# Patient Record
Sex: Female | Born: 1990 | Race: Black or African American | Hispanic: No | Marital: Single | Smoking: Never smoker
Health system: Southern US, Community
[De-identification: ages and names within clinical notes are randomized; demographics above are authoritative.]

---

## 2018-08-25 ENCOUNTER — Encounter: Payer: Self-pay | Admitting: Obstetrics and Gynecology

## 2019-12-18 ENCOUNTER — Ambulatory Visit: Payer: Medicaid Other

## 2020-01-06 ENCOUNTER — Emergency Department
Admission: EM | Admit: 2020-01-06 | Discharge: 2020-01-06 | Disposition: A | Discharge status: Home / Self Care | Payer: Medicaid Other | Attending: Emergency Medicine | Admitting: Emergency Medicine

## 2020-01-06 ENCOUNTER — Encounter: Payer: Self-pay | Admitting: Radiology

## 2020-01-06 ENCOUNTER — Emergency Department: Payer: Medicaid Other

## 2020-01-06 ENCOUNTER — Other Ambulatory Visit: Payer: Self-pay

## 2020-01-06 DIAGNOSIS — R55 Syncope and collapse: Secondary | ICD-10-CM | POA: Insufficient documentation

## 2020-01-06 DIAGNOSIS — R519 Headache, unspecified: Secondary | ICD-10-CM | POA: Diagnosis not present

## 2020-01-06 DIAGNOSIS — Y9389 Activity, other specified: Secondary | ICD-10-CM | POA: Insufficient documentation

## 2020-01-06 DIAGNOSIS — R0789 Other chest pain: Secondary | ICD-10-CM | POA: Insufficient documentation

## 2020-01-06 DIAGNOSIS — Y999 Unspecified external cause status: Secondary | ICD-10-CM | POA: Diagnosis not present

## 2020-01-06 DIAGNOSIS — Y92410 Unspecified street and highway as the place of occurrence of the external cause: Secondary | ICD-10-CM | POA: Insufficient documentation

## 2020-01-06 DIAGNOSIS — R112 Nausea with vomiting, unspecified: Secondary | ICD-10-CM | POA: Insufficient documentation

## 2020-01-06 DIAGNOSIS — M546 Pain in thoracic spine: Secondary | ICD-10-CM | POA: Diagnosis present

## 2020-01-06 LAB — BASIC METABOLIC PANEL
Anion gap: 12 (ref 5–15)
BUN: 12 mg/dL (ref 6–20)
CO2: 27 mmol/L (ref 22–32)
Calcium: 9.2 mg/dL (ref 8.9–10.3)
Chloride: 105 mmol/L (ref 98–111)
Creatinine, Ser: 0.49 mg/dL (ref 0.44–1.00)
GFR calc Af Amer: >60 mL/min (ref >60)
GFR calc non Af Amer: >60 mL/min (ref >60)
Glucose, Bld: 100 mg/dL — ABNORMAL HIGH (ref 70–99)
Potassium: 3.6 mmol/L (ref 3.5–5.1)
Sodium: 144 mmol/L (ref 135–145)

## 2020-01-06 LAB — CBC
HCT: 39.3 % (ref 36.0–46.0)
Hemoglobin: 13.7 g/dL (ref 12.0–15.0)
MCH: 26.1 pg (ref 26.0–34.0)
MCHC: 34.9 g/dL (ref 30.0–36.0)
MCV: 74.9 fL — ABNORMAL LOW (ref 80.0–100.0)
Platelets: 321 10*3/uL (ref 150–400)
RBC: 5.25 MIL/uL — ABNORMAL HIGH (ref 3.87–5.11)
RDW: 14.8 % (ref 11.5–15.5)
WBC: 8 10*3/uL (ref 4.0–10.5)
nRBC: 0 % (ref 0.0–0.2)

## 2020-01-06 LAB — HEPATIC FUNCTION PANEL
ALT: 21 U/L (ref 0–44)
AST: 22 U/L (ref 15–41)
Albumin: 4.8 g/dL (ref 3.5–5.0)
Alkaline Phosphatase: 42 U/L (ref 38–126)
Bilirubin, Direct: <0.1 mg/dL (ref 0.0–0.2)
Total Bilirubin: 0.8 mg/dL (ref 0.3–1.2)
Total Protein: 8.1 g/dL (ref 6.5–8.1)

## 2020-01-06 LAB — HCG, QUANTITATIVE, PREGNANCY: hCG, Beta Chain, Quant, S: <1 m[IU]/mL (ref <5)

## 2020-01-06 LAB — TROPONIN I (HIGH SENSITIVITY): Troponin I (High Sensitivity): <2 ng/L (ref <18)

## 2020-01-06 MED ORDER — SODIUM CHLORIDE 0.9% FLUSH
3.0000 mL | Freq: Once | INTRAVENOUS | Status: AC
  Administered 2020-01-06: 3 mL via INTRAVENOUS

## 2020-01-06 MED ORDER — ACETAMINOPHEN 500 MG PO TABS
1000.0000 mg | ORAL_TABLET | Freq: Once | ORAL | Status: AC
  Administered 2020-01-06: 1000 mg via ORAL
  Filled 2020-01-06: qty 2

## 2020-01-06 MED ORDER — ONDANSETRON HCL 4 MG/2ML IJ SOLN
4.0000 mg | Freq: Once | INTRAMUSCULAR | Status: AC
  Administered 2020-01-06: 4 mg via INTRAVENOUS
  Filled 2020-01-06: qty 2

## 2020-01-06 MED ORDER — NAPROXEN 500 MG PO TABS
500.0000 mg | ORAL_TABLET | Freq: Two times a day (BID) | ORAL | 2 refills | Status: DC
Start: 2020-01-06 — End: 2020-11-24

## 2020-01-06 MED ORDER — OXYCODONE HCL 5 MG PO TABS
5.0000 mg | ORAL_TABLET | Freq: Once | ORAL | Status: AC
  Administered 2020-01-06: 5 mg via ORAL
  Filled 2020-01-06: qty 1

## 2020-01-06 MED ORDER — IOHEXOL 300 MG/ML  SOLN
75.0000 mL | Freq: Once | INTRAMUSCULAR | Status: AC | PRN
  Administered 2020-01-06: 75 mL via INTRAVENOUS

## 2020-01-06 MED ORDER — LIDOCAINE 5 % EX PTCH
1.0000 | MEDICATED_PATCH | CUTANEOUS | Status: DC
  Administered 2020-01-06: 1 via TRANSDERMAL
  Filled 2020-01-06: qty 1
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
## 2020-01-06 NOTE — ED Notes (Signed)
Called and spoke with Trinna Post in Pharmacy and they will send up lidocaine patch since pyxis is out.
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
## 2020-01-06 NOTE — ED Notes (Signed)
Pt transported to CT ?

## 2020-01-06 NOTE — ED Notes (Signed)
This RN called lab to inform them creatinine is back so pt can go to CT.

## 2020-01-06 NOTE — ED Notes (Signed)
Called lab and spoke with [facility] and they will add on BMP to blood already in lab
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
## 2020-04-17 ENCOUNTER — Ambulatory Visit: Payer: Medicaid Other

## 2020-04-30 ENCOUNTER — Ambulatory Visit: Payer: Medicaid Other

## 2020-05-29 ENCOUNTER — Other Ambulatory Visit: Payer: Self-pay

## 2020-05-29 ENCOUNTER — Emergency Department
Admission: EM | Admit: 2020-05-29 | Discharge: 2020-05-29 | Disposition: A | Discharge status: Home / Self Care | Payer: Medicaid Other | Attending: Emergency Medicine | Admitting: Emergency Medicine

## 2020-05-29 DIAGNOSIS — Z20822 Contact with and (suspected) exposure to covid-19: Secondary | ICD-10-CM | POA: Insufficient documentation

## 2020-05-29 LAB — RESPIRATORY PANEL BY RT PCR (FLU A&B, COVID)
Influenza A by PCR: NEGATIVE
Influenza B by PCR: NEGATIVE
SARS Coronavirus 2 by RT PCR: NEGATIVE

## 2020-05-29 NOTE — ED Provider Notes (Signed)
[facility] Emergency Department Provider Note   ____________________________________________    I have reviewed the triage vital signs and the nursing notes.   HISTORY  Chief Complaint Covid Exposure     HPI Carol Lyons is a 29 y.o. female who presents with possible Covid exposure.  Patient reports she needs a Covid swab for work.  She reports she has been feeling "queasy over the last several days however right now feels improved.  Denies abdominal pain.  No loss of taste or smell.  Has not been vaccinated.  No fevers chills or cough.  Kids all have viral symptoms, 2 of them have loss of taste and smell.  History reviewed. No pertinent past medical history.  There are no problems to display for this patient.   History reviewed. No pertinent surgical history.  Prior to Admission medications   Medication Sig Start Date End Date Taking? Authorizing Provider  naproxen (NAPROSYN) 500 MG tablet Take 1 tablet (500 mg total) by mouth 2 (two) times daily with a meal. 01/06/20   Jene Every, MD     Allergies Patient has no known allergies.  No family history on file.  Social History Social History   Tobacco Use  . Smoking status: Never Smoker  . Smokeless tobacco: Never Used  Substance Use Topics  . Alcohol use: Yes    Comment: occassionaly  . Drug use: Never    Review of Systems  Constitutional: No fever/chills Eyes: No visual changes.  ENT: No sore throat. Cardiovascular: Denies chest pain. Respiratory: Denies shortness of breath. Gastrointestinal: As above Genitourinary: Negative for dysuria. Musculoskeletal: Negative for back pain. Skin: Negative for rash. Neurological: Negative for headaches    ____________________________________________   PHYSICAL EXAM:  VITAL SIGNS: ED Triage Vitals [05/29/20 1124]  Enc Vitals Group     BP 111/77     Pulse Rate 85     Resp 16     Temp 98.6 F (37 C)     Temp Source Oral      SpO2 98 %     Weight 81.6 kg (180 lb)     Height 1.575 m (5\' 2" )     Head Circumference      Peak Flow      Pain Score 3     Pain Loc      Pain Edu?      Excl. in GC?     Constitutional: Alert and oriented.  Nose: No congestion/rhinnorhea. Mouth/Throat: Mucous membranes are moist.    Cardiovascular: Good peripheral circulation. Respiratory: Normal respiratory effort.  No retractions   Musculoskeletal:  Warm and well perfused Neurologic:  Normal speech and language. No gross focal neurologic deficits are appreciated.  Skin:  Skin is warm, dry and intact.  Psychiatric: Mood and affect are normal. Speech and behavior are normal.  ____________________________________________   LABS (all labs ordered are listed, but only abnormal results are displayed)  Labs Reviewed  RESPIRATORY PANEL BY RT PCR (FLU A&B, COVID)   ____________________________________________  EKG  None ____________________________________________  RADIOLOGY  None ____________________________________________   PROCEDURES  Procedure(s) performed: No  Procedures   Critical Care performed: No ____________________________________________   INITIAL IMPRESSION / ASSESSMENT AND PLAN / ED COURSE  Pertinent labs & imaging results that were available during my care of the patient were reviewed by me and considered in my medical decision making (see chart for details).  Patient well-appearing and asymptomatic here in the emergency department.  Vital signs are reassuring,  afebrile.  No complaints of cough or shortness of breath, sore throat or congestion.  Covid swab obtained, quarantine until results    ____________________________________________   FINAL CLINICAL IMPRESSION(S) / ED DIAGNOSES  Final diagnoses:  Close exposure to COVID-19 virus        Note:  This document was prepared using Dragon voice recognition software and may include unintentional dictation errors.   Jene Every, MD 05/29/20 1355

## 2020-05-29 NOTE — ED Triage Notes (Addendum)
Pt to the er for abd pain and vomiting x 3 days. Pt reports cough and runny nose x 3 days. Pt last emesis was this morning. Pt in no acute distress. Mom has 3 children with her. LMP 05/04/20. All exposed to covid. Pt states she is more concerned with covid. Pt states abd pain is mild.

## 2020-06-12 ENCOUNTER — Encounter: Payer: Self-pay | Admitting: Intensive Care

## 2020-06-12 ENCOUNTER — Other Ambulatory Visit: Payer: Self-pay

## 2020-06-12 ENCOUNTER — Emergency Department
Admission: EM | Admit: 2020-06-12 | Discharge: 2020-06-12 | Disposition: A | Discharge status: Home / Self Care | Payer: Medicaid Other | Attending: Emergency Medicine | Admitting: Emergency Medicine

## 2020-06-12 DIAGNOSIS — H9201 Otalgia, right ear: Secondary | ICD-10-CM | POA: Insufficient documentation

## 2020-06-12 MED ORDER — NEOMYCIN-POLYMYXIN-HC 3.5-10000-1 OT SUSP
3.0000 [drp] | Freq: Four times a day (QID) | OTIC | Status: DC
  Filled 2020-06-12: qty 10

## 2020-06-12 MED ORDER — TRAMADOL HCL 50 MG PO TABS: 50.0000 mg | ORAL_TABLET | Freq: Four times a day (QID) | ORAL | 0 refills | Status: DC | PRN

## 2020-06-12 MED ORDER — NEOMYCIN-COLIST-HC-THONZONIUM 3.3-3-10-0.5 MG/ML OT SUSP
3.0000 [drp] | Freq: Four times a day (QID) | OTIC | Status: DC
  Filled 2020-06-12: qty 10

## 2020-06-12 MED ORDER — NEOMYCIN-COLIST-HC-THONZONIUM 3.3-3-10-0.5 MG/ML OT SUSP: 3.0000 [drp] | Freq: Four times a day (QID) | OTIC | Status: DC

## 2020-06-12 MED ORDER — TRAMADOL HCL 50 MG PO TABS
50.0000 mg | ORAL_TABLET | Freq: Once | ORAL | Status: AC
  Administered 2020-06-12: 50 mg via ORAL
  Filled 2020-06-12: qty 1

## 2020-06-12 NOTE — ED Provider Notes (Signed)
[facility] Emergency Department Provider Note   ____________________________________________   First MD Initiated Contact with Patient 06/12/20 [phone]     (approximate)  I have reviewed the triage vital signs and the nursing notes.   HISTORY  Chief Complaint Otalgia    HPI Saprina Lyons is a 29 y.o. female patient complaining of right ear pain for 4 days.  Patient denies hearing loss or vertigo.  Rates the pain as a 10/10.  Described pain is "achy".  Reports intermitting drainage.  No palliative measure for complaint.         History reviewed. No pertinent past medical history.  There are no problems to display for this patient.   History reviewed. No pertinent surgical history.  Prior to Admission medications   Medication Sig Start Date End Date Taking? Authorizing Provider  naproxen (NAPROSYN) 500 MG tablet Take 1 tablet (500 mg total) by mouth 2 (two) times daily with a meal. 01/06/20   Jene Every, MD    Allergies Patient has no known allergies.  History reviewed. No pertinent family history.  Social History Social History   Tobacco Use  . Smoking status: Never Smoker  . Smokeless tobacco: Never Used  Substance Use Topics  . Alcohol use: Yes    Comment: occassionaly  . Drug use: Never    Review of Systems Constitutional: No fever/chills Eyes: No visual changes. ENT: No sore throat. EARS: Right ear pain. Cardiovascular: Denies chest pain. Respiratory: Denies shortness of breath. Gastrointestinal: No abdominal pain.  No nausea, no vomiting.  No diarrhea.  No constipation. Genitourinary: Negative for dysuria. Musculoskeletal: Negative for back pain. Skin: Negative for rash. Neurological: Negative for headaches, focal weakness or numbness.   ____________________________________________   PHYSICAL EXAM:  VITAL SIGNS: ED Triage Vitals  Enc Vitals Group     BP --      Pulse --      Resp --      Temp --       Temp src --      SpO2 --      Weight 06/12/20 0846 160 lb (72.6 kg)     Height 06/12/20 0846 5\' 2"  (1.575 m)     Head Circumference --      Peak Flow --      Pain Score 06/12/20 0845 10     Pain Loc --      Pain Edu? --      Excl. in GC? --    Constitutional: Alert and oriented. Well appearing and in no acute distress. Eyes: Conjunctivae are normal. PERRL. EOMI. Head: Atraumatic. Nose: No congestion/rhinnorhea. Mouth/Throat: Mucous membranes are moist.  Oropharynx non-erythematous. EARS: Edematous erythematous right ear canal.  Left ear unremarkable. Hematological/Lymphatic/Immunilogical: No cervical lymphadenopathy. Cardiovascular: Normal rate, regular rhythm. Grossly normal heart sounds.  Good peripheral circulation. Respiratory: Normal respiratory effort.  No retractions. Lungs CTAB. Skin:  Skin is warm, dry and intact. No rash noted. Psychiatric: Mood and affect are normal. Speech and behavior are normal.  ____________________________________________   LABS (all labs ordered are listed, but only abnormal results are displayed)  Labs Reviewed - No data to display ____________________________________________  EKG   ____________________________________________  RADIOLOGY I, 06/14/20, personally viewed and evaluated these images (plain radiographs) as part of my medical decision making, as well as reviewing the written report by the radiologist.  ED MD interpretation:    Official radiology report(s): No results found.  ____________________________________________   PROCEDURES  Procedure(s) performed (including Critical  Care):  Procedures   ____________________________________________   INITIAL IMPRESSION / ASSESSMENT AND PLAN / ED COURSE  As part of my medical decision making, I reviewed the following data within the electronic MEDICAL RECORD NUMBER     Patient presents with 4 days of right ear pain.  Physical exam is consistent with otitis externa.  A  wick was placed in the patient here and neomycin eardrops applied.  Patient was given tramadol for pain.  Patient given discharge care instructions advised continue medication for the next 5 days.  Follow-up with ENT clinic if no improvement in 5 days.          ____________________________________________   FINAL CLINICAL IMPRESSION(S) / ED DIAGNOSES  Final diagnoses:  None     ED Discharge Orders    None      *Please note:  Langley Oplinger was evaluated in Emergency Department on 06/12/2020 for the symptoms described in the history of present illness. She was evaluated in the context of the global COVID-19 pandemic, which necessitated consideration that the patient might be at risk for infection with the SARS-CoV-2 virus that causes COVID-19. Institutional protocols and algorithms that pertain to the evaluation of patients at risk for COVID-19 are in a state of rapid change based on information released by regulatory bodies including the CDC and federal and state organizations. These policies and algorithms were followed during the patient's care in the ED.  Some ED evaluations and interventions may be delayed as a result of limited staffing during and the pandemic.*   Note:  This document was prepared using Dragon voice recognition software and may include unintentional dictation errors. Caralee Ates, PA-C 06/12/20 1003    Phineas Semen, MD 06/12/20 1056

## 2020-06-12 NOTE — ED Triage Notes (Signed)
Patient c/o right ear pain since Monday. Reports some drainage.

## 2020-06-12 NOTE — Discharge Instructions (Signed)
Changed week and ear every 6 hours.  Use 3 drops of ear medication as directed.

## 2020-10-17 ENCOUNTER — Ambulatory Visit: Payer: Medicaid Other

## 2020-11-24 ENCOUNTER — Other Ambulatory Visit: Payer: Self-pay

## 2020-11-24 ENCOUNTER — Emergency Department
Admission: EM | Admit: 2020-11-24 | Discharge: 2020-11-24 | Disposition: A | Discharge status: Home / Self Care | Payer: Medicaid Other | Attending: Emergency Medicine | Admitting: Emergency Medicine

## 2020-11-24 DIAGNOSIS — Z3A00 Weeks of gestation of pregnancy not specified: Secondary | ICD-10-CM | POA: Insufficient documentation

## 2020-11-24 DIAGNOSIS — O211 Hyperemesis gravidarum with metabolic disturbance: Secondary | ICD-10-CM | POA: Insufficient documentation

## 2020-11-24 DIAGNOSIS — O210 Mild hyperemesis gravidarum: Secondary | ICD-10-CM

## 2020-11-24 MED ORDER — SODIUM CHLORIDE 0.9 % IV BOLUS
1000.0000 mL | Freq: Once | INTRAVENOUS | Status: AC
  Administered 2020-11-24: 1000 mL via INTRAVENOUS

## 2020-11-24 MED ORDER — METOCLOPRAMIDE HCL 5 MG/ML IJ SOLN
20.0000 mg | Freq: Once | INTRAVENOUS | Status: AC
  Administered 2020-11-24: 20 mg via INTRAVENOUS
  Filled 2020-11-24: qty 2

## 2020-11-24 NOTE — Discharge Instructions (Addendum)
Follow-up with schedule appointment.

## 2020-11-24 NOTE — ED Triage Notes (Signed)
Pt comes with c/o vomiting. Pt states she is pregnant and has appt on Friday for abortion. Pt states she can't wait that long and needs something sooner. Informed pt that we don't do that here but we can see her for her vomiting.  Pt states she doesn't know how far along she is.

## 2020-11-24 NOTE — ED Notes (Signed)
Says no further vomiting.  ivf done.  She says she feels better and that she can go h ome.

## 2020-11-24 NOTE — ED Notes (Signed)
See triage note  Presents with n/v  States she is pregnant and has been vomiting  Unable to keep anything down  Stats she is scheduled for abortion on Friday    States she would like to have it done sooner  Explained to pt that we do not do that procedure here

## 2020-11-24 NOTE — ED Provider Notes (Signed)
[facility] Emergency Department Provider Note   ____________________________________________   Event Date/Time   First MD Initiated Contact with Patient 11/24/20 0801     (approximate)  I have reviewed the triage vital signs and the nursing notes.   HISTORY  Chief Complaint Emesis During Pregnancy    HPI Carol Lyons is a 30 y.o. female patient presents with vomiting.  Patient states she is pregnant has appointment for abortion in 4 days.  Patient asked that the procedure be done through the emergency room.  Advised patient that we can treat her nausea and vomiting.  Patient would not answer questions about her last menstrual period or her fall along she is her pregnancy.  Patient denies abdominal or pelvic pain.  Patient denies vaginal bleeding.  Patient refused any other interventions i.e. lab tests or ultrasounds.     No past medical history on file.  There are no problems to display for this patient.   No past surgical history on file.  Prior to Admission medications   Not on File    Allergies Patient has no known allergies.  No family history on file.  Social History Social History   Tobacco Use  . Smoking status: Never Smoker  . Smokeless tobacco: Never Used  Substance Use Topics  . Alcohol use: Not Currently    Comment: occassionaly  . Drug use: Never    Review of Systems Constitutional: No fever/chills Eyes: No visual changes. ENT: No sore throat. Cardiovascular: Denies chest pain. Respiratory: Denies shortness of breath. Gastrointestinal: No abdominal pain.  Nausea and vomiting.  No diarrhea.  No constipation. Genitourinary: Negative for dysuria. Musculoskeletal: Negative for back pain. Skin: Negative for rash. Neurological: Negative for headaches, focal weakness or numbness.   ____________________________________________   PHYSICAL EXAM:  VITAL SIGNS: ED Triage Vitals  Enc Vitals Group     BP 11/24/20 0756  (!) 111/100     Pulse Rate 11/24/20 0756 88     Resp 11/24/20 0756 18     Temp 11/24/20 0756 98.3 F (36.8 C)     Temp Source 11/24/20 0756 Oral     SpO2 11/24/20 0756 97 %     Weight 11/24/20 0753 140 lb (63.5 kg)     Height 11/24/20 0753 5\' 3"  (1.6 m)     Head Circumference --      Peak Flow --      Pain Score 11/24/20 0753 0     Pain Loc --      Pain Edu? --      Excl. in GC? --    Constitutional: Alert and oriented. Well appearing and in no acute distress. Eyes: Conjunctivae are normal. PERRL. EOMI. Head: Atraumatic. Nose: No congestion/rhinnorhea. Mouth/Throat: Mucous membranes are moist.  Oropharynx non-erythematous. Neck: No stridor. Hematological/Lymphatic/Immunilogical: No cervical lymphadenopathy. Cardiovascular: Normal rate, regular rhythm. Grossly normal heart sounds.  Good peripheral circulation. Respiratory: Normal respiratory effort.  No retractions. Lungs CTAB. Gastrointestinal: Soft and nontender. No distention. No abdominal bruits. No CVA tenderness. Genitourinary: Deferred Musculoskeletal: No lower extremity tenderness nor edema.  No joint effusions. Neurologic:  Normal speech and language. No gross focal neurologic deficits are appreciated. No gait instability. Skin:  Skin is warm, dry and intact. No rash noted. Psychiatric: Mood and affect are normal. Speech and behavior are normal.  ____________________________________________   LABS (all labs ordered are listed, but only abnormal results are displayed)  Labs Reviewed  URINALYSIS, COMPLETE (UACMP) WITH MICROSCOPIC   ____________________________________________  EKG  ____________________________________________  RADIOLOGY Margarite Gouge, personally viewed and evaluated these images (plain radiographs) as part of my medical decision making, as well as reviewing the written report by the radiologist.  ED MD interpretation:    Official radiology report(s): No results  found.  ____________________________________________   PROCEDURES  Procedure(s) performed (including Critical Care):  Procedures   ____________________________________________   INITIAL IMPRESSION / ASSESSMENT AND PLAN / ED COURSE  As part of my medical decision making, I reviewed the following data within the electronic MEDICAL RECORD NUMBER         Patient stated vomiting secondary gestation.  Patient states she was unable to tolerate food or fluids secondary to nausea and vomiting.  Patient is scheduled for an abortion tomorrow.  Patient state relief with IV fluids and Reglan.  Refused any further intervention or discharge medications.  Patient will follow up abortion appointment tomorrow morning.      ____________________________________________   FINAL CLINICAL IMPRESSION(S) / ED DIAGNOSES  Final diagnoses:  Hyperemesis gravidarum     ED Discharge Orders    None      *Please note:  Carol Lyons was evaluated in Emergency Department on 11/24/2020 for the symptoms described in the history of present illness. She was evaluated in the context of the global COVID-19 pandemic, which necessitated consideration that the patient might be at risk for infection with the SARS-CoV-2 virus that causes COVID-19. Institutional protocols and algorithms that pertain to the evaluation of patients at risk for COVID-19 are in a state of rapid change based on information released by regulatory bodies including the CDC and federal and state organizations. These policies and algorithms were followed during the patient's care in the ED.  Some ED evaluations and interventions may be delayed as a result of limited staffing during and the pandemic.*   Note:  This document was prepared using Dragon voice recognition software and may include unintentional dictation errors.    Joni Reining, PA-C 11/24/20 1025    Chesley Noon, MD 11/25/20 [phone]

## 2020-12-16 ENCOUNTER — Ambulatory Visit: Payer: Medicaid Other
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
## 2021-07-01 ENCOUNTER — Ambulatory Visit: Payer: Medicaid Other

## 2021-09-10 ENCOUNTER — Ambulatory Visit: Payer: Medicaid Other

## 2022-02-16 ENCOUNTER — Ambulatory Visit: Payer: Medicaid Other

## 2022-04-30 ENCOUNTER — Emergency Department: Payer: Medicaid Other

## 2022-04-30 ENCOUNTER — Emergency Department
Admission: EM | Admit: 2022-04-30 | Discharge: 2022-04-30 | Discharge status: Against Medical Advice | Payer: Medicaid Other | Attending: Emergency Medicine | Admitting: Emergency Medicine

## 2022-04-30 ENCOUNTER — Other Ambulatory Visit: Payer: Self-pay

## 2022-04-30 DIAGNOSIS — O26899 Other specified pregnancy related conditions, unspecified trimester: Secondary | ICD-10-CM | POA: Insufficient documentation

## 2022-04-30 DIAGNOSIS — Z5321 Procedure and treatment not carried out due to patient leaving prior to being seen by health care provider: Secondary | ICD-10-CM | POA: Diagnosis not present

## 2022-04-30 DIAGNOSIS — Z3A00 Weeks of gestation of pregnancy not specified: Secondary | ICD-10-CM | POA: Diagnosis not present

## 2022-04-30 LAB — COMPREHENSIVE METABOLIC PANEL
ALT: 16 U/L (ref 0–44)
AST: 18 U/L (ref 15–41)
Albumin: 4.1 g/dL (ref 3.5–5.0)
Alkaline Phosphatase: 39 U/L (ref 38–126)
Anion gap: 8 (ref 5–15)
BUN: 13 mg/dL (ref 6–20)
CO2: 22 mmol/L (ref 22–32)
Calcium: 9.3 mg/dL (ref 8.9–10.3)
Chloride: 107 mmol/L (ref 98–111)
Creatinine, Ser: 0.48 mg/dL (ref 0.44–1.00)
GFR, Estimated: >60 mL/min (ref >60)
Glucose, Bld: 109 mg/dL — ABNORMAL HIGH (ref 70–99)
Potassium: 3.6 mmol/L (ref 3.5–5.1)
Sodium: 137 mmol/L (ref 135–145)
Total Bilirubin: 0.3 mg/dL (ref 0.3–1.2)
Total Protein: 7.2 g/dL (ref 6.5–8.1)

## 2022-04-30 LAB — CBC WITH DIFFERENTIAL/PLATELET
Abs Immature Granulocytes: 0.03 10*3/uL (ref 0.00–0.07)
Basophils Absolute: 0.1 10*3/uL (ref 0.0–0.1)
Basophils Relative: 0 %
Eosinophils Absolute: 0.2 10*3/uL (ref 0.0–0.5)
Eosinophils Relative: 2 %
HCT: 35.8 % — ABNORMAL LOW (ref 36.0–46.0)
Hemoglobin: 12.2 g/dL (ref 12.0–15.0)
Immature Granulocytes: 0 %
Lymphocytes Relative: 29 %
Lymphs Abs: 3.3 10*3/uL (ref 0.7–4.0)
MCH: 25.5 pg — ABNORMAL LOW (ref 26.0–34.0)
MCHC: 34.1 g/dL (ref 30.0–36.0)
MCV: 74.7 fL — ABNORMAL LOW (ref 80.0–100.0)
Monocytes Absolute: 0.8 10*3/uL (ref 0.1–1.0)
Monocytes Relative: 7 %
Neutro Abs: 6.9 10*3/uL (ref 1.7–7.7)
Neutrophils Relative %: 62 %
Platelets: 294 10*3/uL (ref 150–400)
RBC: 4.79 MIL/uL (ref 3.87–5.11)
RDW: 14.7 % (ref 11.5–15.5)
WBC: 11.4 10*3/uL — ABNORMAL HIGH (ref 4.0–10.5)
nRBC: 0 % (ref 0.0–0.2)

## 2022-04-30 LAB — POC URINE PREG, ED: Preg Test, Ur: POSITIVE — AB

## 2022-04-30 LAB — URINALYSIS, ROUTINE W REFLEX MICROSCOPIC
Bilirubin Urine: NEGATIVE
Glucose, UA: NEGATIVE mg/dL
Hgb urine dipstick: NEGATIVE
Ketones, ur: NEGATIVE mg/dL
Leukocytes,Ua: NEGATIVE
Nitrite: NEGATIVE
Protein, ur: NEGATIVE mg/dL
Specific Gravity, Urine: 1.024 (ref 1.005–1.030)
pH: 7 (ref 5.0–8.0)

## 2022-04-30 LAB — HCG, QUANTITATIVE, PREGNANCY: hCG, Beta Chain, Quant, S: 1136 m[IU]/mL — ABNORMAL HIGH (ref <5)

## 2022-04-30 NOTE — ED Notes (Signed)
Called pt several times no answer  

## 2022-04-30 NOTE — ED Triage Notes (Addendum)
Pt arrives with c/o lower left pelvis pain that started today. Pt has hx of ectopic pregnancy. Pt had positive urine pregnancy in triage and did not know about pregnancy. Per pt, this pain is the same pain as the last ectopic pregnancy.  Pt denies n/v or vaginal bleeding.

## 2022-05-21 ENCOUNTER — Emergency Department
Admission: EM | Admit: 2022-05-21 | Discharge: 2022-05-21 | Disposition: A | Discharge status: Home / Self Care | Payer: Medicaid Other | Attending: Emergency Medicine | Admitting: Emergency Medicine

## 2022-05-21 ENCOUNTER — Encounter: Payer: Self-pay | Admitting: Emergency Medicine

## 2022-05-21 ENCOUNTER — Other Ambulatory Visit: Payer: Self-pay

## 2022-05-21 ENCOUNTER — Emergency Department: Payer: Medicaid Other

## 2022-05-21 DIAGNOSIS — O211 Hyperemesis gravidarum with metabolic disturbance: Secondary | ICD-10-CM | POA: Insufficient documentation

## 2022-05-21 DIAGNOSIS — Z3A01 Less than 8 weeks gestation of pregnancy: Secondary | ICD-10-CM | POA: Insufficient documentation

## 2022-05-21 DIAGNOSIS — R1011 Right upper quadrant pain: Secondary | ICD-10-CM | POA: Diagnosis not present

## 2022-05-21 DIAGNOSIS — R7401 Elevation of levels of liver transaminase levels: Secondary | ICD-10-CM | POA: Diagnosis not present

## 2022-05-21 DIAGNOSIS — O210 Mild hyperemesis gravidarum: Secondary | ICD-10-CM

## 2022-05-21 LAB — CBC
HCT: 43.8 % (ref 36.0–46.0)
Hemoglobin: 15.4 g/dL — ABNORMAL HIGH (ref 12.0–15.0)
MCH: 26 pg (ref 26.0–34.0)
MCHC: 35.2 g/dL (ref 30.0–36.0)
MCV: 74 fL — ABNORMAL LOW (ref 80.0–100.0)
Platelets: 394 10*3/uL (ref 150–400)
RBC: 5.92 MIL/uL — ABNORMAL HIGH (ref 3.87–5.11)
RDW: 13.8 % (ref 11.5–15.5)
WBC: 9.2 10*3/uL (ref 4.0–10.5)
nRBC: 0 % (ref 0.0–0.2)

## 2022-05-21 LAB — COMPREHENSIVE METABOLIC PANEL
ALT: 165 U/L — ABNORMAL HIGH (ref 0–44)
AST: 94 U/L — ABNORMAL HIGH (ref 15–41)
Albumin: 5.1 g/dL — ABNORMAL HIGH (ref 3.5–5.0)
Alkaline Phosphatase: 73 U/L (ref 38–126)
Anion gap: 13 (ref 5–15)
BUN: 17 mg/dL (ref 6–20)
CO2: 30 mmol/L (ref 22–32)
Calcium: 9.9 mg/dL (ref 8.9–10.3)
Chloride: 92 mmol/L — ABNORMAL LOW (ref 98–111)
Creatinine, Ser: 0.6 mg/dL (ref 0.44–1.00)
GFR, Estimated: >60 mL/min (ref >60)
Glucose, Bld: 123 mg/dL — ABNORMAL HIGH (ref 70–99)
Potassium: 3.1 mmol/L — ABNORMAL LOW (ref 3.5–5.1)
Sodium: 135 mmol/L (ref 135–145)
Total Bilirubin: 4.4 mg/dL — ABNORMAL HIGH (ref 0.3–1.2)
Total Protein: 9.2 g/dL — ABNORMAL HIGH (ref 6.5–8.1)

## 2022-05-21 LAB — LIPASE, BLOOD: Lipase: 31 U/L (ref 11–51)

## 2022-05-21 MED ORDER — METOCLOPRAMIDE HCL 5 MG/ML IJ SOLN
10.0000 mg | Freq: Once | INTRAMUSCULAR | Status: AC
  Administered 2022-05-21: 10 mg via INTRAVENOUS
  Filled 2022-05-21: qty 2

## 2022-05-21 MED ORDER — SODIUM CHLORIDE 0.9 % IV BOLUS
1000.0000 mL | Freq: Once | INTRAVENOUS | Status: AC
  Administered 2022-05-21: 1000 mL via INTRAVENOUS

## 2022-05-21 MED ORDER — METOCLOPRAMIDE HCL 10 MG PO TABS: 10.0000 mg | ORAL_TABLET | Freq: Three times a day (TID) | ORAL | 0 refills | Status: AC | PRN

## 2022-05-21 NOTE — ED Notes (Signed)
Pt not able to provide urine sample at this time. Pt educated on need to urine sample. Pt given cup to provide sample when able.

## 2022-05-21 NOTE — ED Provider Notes (Signed)
[facility] Provider Note    Event Date/Time   First MD Initiated Contact with Patient 05/21/22 0820     (approximate)  History   Chief Complaint: Emesis  HPI  Carol Lyons is a 31 y.o. female G10 P3 at approximately 7 weeks 4 days per patient presents emergency department for nausea and vomiting.  According to the patient since getting pregnant she has been very nauseated with frequent episodes of vomiting.  Patient states she is scheduled for an elective abortion on Monday.  States she has not been able to keep anything down over the past several days which came to the emergency department for evaluation.  Patient denies abdominal "pain" but does state cramping at times which she relates to the nausea and vomiting.  Physical Exam   Triage Vital Signs: ED Triage Vitals [05/21/22 0726]  Enc Vitals Group     BP (!) 140/98     Pulse Rate 91     Resp 18     Temp 98.3 F (36.8 C)     Temp Source Oral     SpO2 92 %     Weight      Height      Head Circumference      Peak Flow      Pain Score      Pain Loc      Pain Edu?      Excl. in GC?     Most recent vital signs: Vitals:   05/21/22 0726  BP: (!) 140/98  Pulse: 91  Resp: 18  Temp: 98.3 F (36.8 C)  SpO2: 92%    General: Awake, no distress.  CV:  Good peripheral perfusion.  Regular rate and rhythm  Resp:  Normal effort.  Equal breath sounds bilaterally.  Abd:  No distention.  Soft, nontender.  No rebound or guarding.  Benign abdomen  ED Results / Procedures / Treatments   MEDICATIONS ORDERED IN ED: Medications  metoCLOPramide (REGLAN) injection 10 mg (has no administration in time range)  sodium chloride 0.9 % bolus 1,000 mL (has no administration in time range)     IMPRESSION / MDM / ASSESSMENT AND PLAN / ED COURSE  I reviewed the triage vital signs and the nursing notes.  Patient's presentation is most consistent with acute presentation with potential threat to life or  bodily function.  Patient presents emergency department for nausea vomiting approximately 7 weeks 4 days pregnant.  We will check labs, we will treat with nausea medication, IV fluids and continue to closely monitor.  Patient agreeable to plan of care.  Lab work does show elevated LFTs this appears to be new compared to her last blood work.  Patient did have an elevated bilirubin in care everywhere in 2020 as well.  We will obtain a right upper quadrant ultrasound as a precaution patient denies any abdominal pain.  The remainder of the lab work is reassuring with a normal CBC, normal lipase.  Patient states she is feeling better after nausea medication.  She is about halfway through her IV fluids.  Right upper quadrant ultrasound is negative.  Patient states she is feeling better after Reglan.  We will discharge patient home with Reglan and OB/GYN follow-up on Monday as scheduled.  Provided return precautions.  Patient agreeable to plan.    FINAL CLINICAL IMPRESSION(S) / ED DIAGNOSES   Hyperemesis gravidarum   Note:  This document was prepared using Dragon voice recognition software and may include unintentional dictation  errors.   Harvest Dark, MD 05/21/22 1429

## 2022-05-21 NOTE — ED Triage Notes (Signed)
Pt to ED via POV, pt states that she is currently [redacted] weeks pregnant and is not able to keep anything down. Pt states that she has been drinking water but feels like she is dehydrated. Pt is in NAD.

## 2022-08-05 ENCOUNTER — Other Ambulatory Visit: Payer: Self-pay

## 2022-08-05 ENCOUNTER — Emergency Department
Admission: EM | Admit: 2022-08-05 | Discharge: 2022-08-05 | Disposition: A | Discharge status: Home / Self Care | Payer: Medicaid Other | Attending: Emergency Medicine | Admitting: Emergency Medicine

## 2022-08-05 ENCOUNTER — Emergency Department: Payer: Medicaid Other

## 2022-08-05 ENCOUNTER — Encounter: Payer: Self-pay | Admitting: Radiology

## 2022-08-05 DIAGNOSIS — R197 Diarrhea, unspecified: Secondary | ICD-10-CM | POA: Diagnosis not present

## 2022-08-05 DIAGNOSIS — R109 Unspecified abdominal pain: Secondary | ICD-10-CM | POA: Diagnosis present

## 2022-08-05 DIAGNOSIS — R1011 Right upper quadrant pain: Secondary | ICD-10-CM | POA: Diagnosis not present

## 2022-08-05 DIAGNOSIS — R059 Cough, unspecified: Secondary | ICD-10-CM | POA: Diagnosis not present

## 2022-08-05 DIAGNOSIS — R0602 Shortness of breath: Secondary | ICD-10-CM | POA: Diagnosis not present

## 2022-08-05 LAB — URINALYSIS, ROUTINE W REFLEX MICROSCOPIC
Bilirubin Urine: NEGATIVE
Glucose, UA: NEGATIVE mg/dL
Ketones, ur: NEGATIVE mg/dL
Nitrite: NEGATIVE
Protein, ur: 30 mg/dL — AB
Specific Gravity, Urine: 1.029 (ref 1.005–1.030)
pH: 5 (ref 5.0–8.0)

## 2022-08-05 LAB — LIPASE, BLOOD: Lipase: 29 U/L (ref 11–51)

## 2022-08-05 LAB — COMPREHENSIVE METABOLIC PANEL
ALT: 120 U/L — ABNORMAL HIGH (ref 0–44)
AST: 142 U/L — ABNORMAL HIGH (ref 15–41)
Albumin: 4.3 g/dL (ref 3.5–5.0)
Alkaline Phosphatase: 58 U/L (ref 38–126)
Anion gap: 3 — ABNORMAL LOW (ref 5–15)
BUN: 11 mg/dL (ref 6–20)
CO2: 26 mmol/L (ref 22–32)
Calcium: 8.8 mg/dL — ABNORMAL LOW (ref 8.9–10.3)
Chloride: 107 mmol/L (ref 98–111)
Creatinine, Ser: 0.57 mg/dL (ref 0.44–1.00)
GFR, Estimated: >60 mL/min (ref >60)
Glucose, Bld: 136 mg/dL — ABNORMAL HIGH (ref 70–99)
Potassium: 3.3 mmol/L — ABNORMAL LOW (ref 3.5–5.1)
Sodium: 136 mmol/L (ref 135–145)
Total Bilirubin: 1 mg/dL (ref 0.3–1.2)
Total Protein: 7.7 g/dL (ref 6.5–8.1)

## 2022-08-05 LAB — CBC
HCT: 39.4 % (ref 36.0–46.0)
Hemoglobin: 13.2 g/dL (ref 12.0–15.0)
MCH: 26 pg (ref 26.0–34.0)
MCHC: 33.5 g/dL (ref 30.0–36.0)
MCV: 77.6 fL — ABNORMAL LOW (ref 80.0–100.0)
Platelets: 310 10*3/uL (ref 150–400)
RBC: 5.08 MIL/uL (ref 3.87–5.11)
RDW: 14 % (ref 11.5–15.5)
WBC: 13.8 10*3/uL — ABNORMAL HIGH (ref 4.0–10.5)
nRBC: 0 % (ref 0.0–0.2)

## 2022-08-05 LAB — PREGNANCY, URINE: Preg Test, Ur: NEGATIVE

## 2022-08-05 MED ORDER — ACETAMINOPHEN 500 MG PO TABS
1000.0000 mg | ORAL_TABLET | Freq: Once | ORAL | Status: AC
  Administered 2022-08-05: 1000 mg via ORAL
  Filled 2022-08-05: qty 2

## 2022-08-05 MED ORDER — IBUPROFEN 400 MG PO TABS
400.0000 mg | ORAL_TABLET | Freq: Once | ORAL | Status: AC
  Administered 2022-08-05: 400 mg via ORAL
  Filled 2022-08-05: qty 1

## 2022-08-05 NOTE — Discharge Instructions (Addendum)
The ultrasound of your gallbladder was normal and your chest x-ray did not show any abnormalities.  We are not exactly sure what the cause of your pain is.  If your pain is worsening you develop worsening shortness of breath please return to the emergency department.  Otherwise please follow-up with primary care regarding your elevation in your liver function test.  You can take Motrin for pain.   Please go to the following website to schedule new (and existing) patient appointments:   [email or web address]   The following is a list of primary care offices in the area who are accepting new patients at this time.  Please reach out to one of them directly and let them know you would like to schedule an appointment to follow up on an Emergency Department visit, and/or to establish a new primary care provider (PCP).  There are likely other primary care clinics in the are who are accepting new patients, but this is an excellent place to start:  [facility] Lead physician: Dr Shirlee Latch [address] [place], [place] [address] [phone]  [facility] Lead Physician: Dr Alba Cory [address], [place], [place] [address] [phone]  [facility]  Lead Physician: Dr Olevia Perches [address] [place], [place] [address] [phone]  [facility] Lead Physician: Dr Sofie Hartigan [address], [place], [place] [address] [phone]  [facility] Lead Physician: Dr Bari Edward [address] [place], [place], [place] [address] [phone]

## 2022-08-05 NOTE — ED Triage Notes (Addendum)
First Nurse:  [facility]  Pt complaining of abdominal pain that started at 4am with an episode of diarrhea. Pt had an abortion 3 months ago without complications. Unknown current pregnancy. Pt states right sided pain.   Pt states no abdominal surgery, besides her ectopic pregnancy   EMS Vitals: 120/78 120 HR 95% RA

## 2022-08-05 NOTE — ED Provider Notes (Signed)
[facility] Provider Note    Event Date/Time   First MD Initiated Contact with Patient 08/05/22 1144     (approximate)   History   Abdominal Pain   HPI  Lakechia Mian is a 31 y.o. female with no significant past medical history presents with abdominal pain.  Symptoms started today.  Pain is located in the right upper abdomen is worse when she takes a deep breath.  Does feel mildly short of breath.  Has had mild cough.  I she did have an episode of diarrhea no nausea vomiting.  Denies urinary symptoms.  Had a surgical abortion about 3 months ago was about [redacted] weeks pregnant at that time.  The patient denies hx of prior DVT/PE, unilateral leg pain/swelling, hormone use, recent surgery, hx of cancer, prolonged immobilization, or hemoptysis.    No past medical history on file.  There are no problems to display for this patient.    Physical Exam  Triage Vital Signs: ED Triage Vitals  Enc Vitals Group     BP 08/05/22 1054 118/75     Pulse Rate 08/05/22 1054 87     Resp 08/05/22 1054 18     Temp 08/05/22 1054 98.5 F (36.9 C)     Temp src --      SpO2 08/05/22 1054 97 %     Weight 08/05/22 1055 180 lb (81.6 kg)     Height 08/05/22 1055 5\' 2"  (1.575 m)     Head Circumference --      Peak Flow --      Pain Score 08/05/22 1055 8     Pain Loc --      Pain Edu? --      Excl. in GC? --     Most recent vital signs: Vitals:   08/05/22 1422 08/05/22 1500  BP: 130/83 128/80  Pulse: 98 96  Resp: 18 18  Temp: 98.6 F (37 C)   SpO2: 97% 98%     General: Awake, no distress.  CV:  Good peripheral perfusion.  Resp:  Normal effort.  Abd:  No distention.  Neuro:             Awake, Alert, Oriented x 3  Other:     ED Results / Procedures / Treatments  Labs (all abs ordered are listed, but only abnormal results are displayed) Labs Reviewed  COMPREHENSIVE METABOLIC PANEL - Abnormal; Notable for the following components:      Result Value    Potassium 3.3 (*)    Glucose, Bld 136 (*)    Calcium 8.8 (*)    AST 142 (*)    ALT 120 (*)    Anion gap 3 (*)    All other components within normal limits  CBC - Abnormal; Notable for the following components:   WBC 13.8 (*)    MCV 77.6 (*)    All other components within normal limits  URINALYSIS, ROUTINE W REFLEX MICROSCOPIC - Abnormal; Notable for the following components:   Color, Urine AMBER (*)    APPearance CLOUDY (*)    Hgb urine dipstick SMALL (*)    Protein, ur 30 (*)    Leukocytes,Ua LARGE (*)    Bacteria, UA MANY (*)    All other components within normal limits  LIPASE, BLOOD  PREGNANCY, URINE  POC URINE PREG, ED     EKG  Reviewed and interpreted the right upper quadrant ultrasound which is negative for cholecystitis   RADIOLOGY Reviewed  and interpreted the right upper quadrant ultrasound which is negative for cholecystitis   RADI   PROCEDURES:  Critical Care performed: No  Procedures   MEDICATIONS ORDERED IN ED: Medications  ibuprofen (ADVIL) tablet 400 mg (has no administration in time range)  acetaminophen (TYLENOL) tablet 1,000 mg (has no administration in time range)     IMPRESSION / MDM / ASSESSMENT AND PLAN / ED COURSE  I reviewed the triage vital signs and the nursing notes.                              Patient's presentation is most consistent with acute complicated illness / injury requiring diagnostic workup.  Differential diagnosis includes, but is not limited to, cholecystitis, biliary colic, pancreatitis, hepatitis, gastritis, peptic ulcer   Clinical Course as of 08/05/22 1539  Thu Aug 05, 2022  1454 Preg Test, Ur: NEGATIVE [KM]    Clinical Course User Index [KM] Rada Hay, MD  Patient is a 31 year old female presenting with 1 day of right upper quadrant pain and an episode of diarrhea.  Patient looks comfortable vitals are reassuring.  Does have a leukocytosis to about 14 AST and ALT are both elevated but bili is  normal, as is the lipase.  She is tender primarily in the right upper quadrant there is no guarding.  Will obtain right upper quadrant ultrasound to rule out cholecystitis especially given her abnormal LFTs.  Of note they have been elevated 2 months ago as well  Patient's right upper quadrant ultrasound is negative.  Considered diagnosis of pulmonary embolism but patient is not tachycardic not hypoxic and has no risk factors.  Was pregnant 3 months ago but was only in her first trimester now that she is 3 months out feel that this is no longer a risk factor.  Did obtain a chest x-ray to rule out pneumonia as cause of her right upper quadrant pain and this is negative.  Recommended supportive care with Tylenol Motrin and to follow-up with PCP regarding her LFTs.  Patient not not have a primary care doctor currently I referred her.   FINAL CLINICAL IMPRESSION(S) / ED DIAGNOSES   Final diagnoses:  RUQ pain     Rx / DC Orders   ED Discharge Orders     None        Note:  This document was prepared using Dragon voice recognition software and may include unintentional dictation errors.   Rada Hay, MD 08/05/22 1539

## 2022-10-30 ENCOUNTER — Emergency Department
Admission: EM | Admit: 2022-10-30 | Discharge: 2022-10-30 | Disposition: A | Discharge status: Home / Self Care | Payer: Medicaid Other | Attending: Emergency Medicine | Admitting: Emergency Medicine

## 2022-10-30 ENCOUNTER — Other Ambulatory Visit: Payer: Self-pay

## 2022-10-30 ENCOUNTER — Emergency Department: Payer: Medicaid Other

## 2022-10-30 DIAGNOSIS — T148XXA Other injury of unspecified body region, initial encounter: Secondary | ICD-10-CM | POA: Diagnosis not present

## 2022-10-30 DIAGNOSIS — S0093XA Contusion of unspecified part of head, initial encounter: Secondary | ICD-10-CM | POA: Diagnosis not present

## 2022-10-30 DIAGNOSIS — Y92410 Unspecified street and highway as the place of occurrence of the external cause: Secondary | ICD-10-CM | POA: Diagnosis not present

## 2022-10-30 DIAGNOSIS — S0990XA Unspecified injury of head, initial encounter: Secondary | ICD-10-CM

## 2022-10-30 MED ORDER — CYCLOBENZAPRINE HCL 5 MG PO TABS: 5.0000 mg | ORAL_TABLET | Freq: Three times a day (TID) | ORAL | 0 refills | Status: AC | PRN

## 2022-10-30 MED ORDER — ONDANSETRON 4 MG PO TBDP: 4.0000 mg | ORAL_TABLET | Freq: Three times a day (TID) | ORAL | 0 refills | Status: DC | PRN
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
## 2022-10-30 NOTE — ED Notes (Signed)
ED Provider at bedside. 

## 2022-10-30 NOTE — Discharge Instructions (Addendum)
Your exam and CT scan are normal and reassuring at this time but no signs of a serious closed head injury.  Take prescription meds as directed.  Follow-up with your primary provider or return to ED if needed.

## 2022-10-30 NOTE — ED Notes (Signed)
Patient transported back from CT 
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
## 2022-10-30 NOTE — ED Notes (Signed)
Patient transported to CT 

## 2022-10-30 NOTE — ED Notes (Signed)
Pt verbalized understanding of DC instructions. Signing pad did not work.   
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
## 2022-11-10 ENCOUNTER — Emergency Department
Admission: EM | Admit: 2022-11-10 | Discharge: 2022-11-10 | Disposition: A | Discharge status: Home / Self Care | Payer: Medicaid Other | Attending: Emergency Medicine | Admitting: Emergency Medicine

## 2022-11-10 ENCOUNTER — Other Ambulatory Visit: Payer: Self-pay

## 2022-11-10 DIAGNOSIS — Z1152 Encounter for screening for COVID-19: Secondary | ICD-10-CM | POA: Diagnosis not present

## 2022-11-10 DIAGNOSIS — J069 Acute upper respiratory infection, unspecified: Secondary | ICD-10-CM | POA: Diagnosis not present

## 2022-11-10 DIAGNOSIS — R059 Cough, unspecified: Secondary | ICD-10-CM | POA: Diagnosis present

## 2022-11-10 LAB — POC URINE PREG, ED: Preg Test, Ur: NEGATIVE

## 2022-11-10 LAB — RESP PANEL BY RT-PCR (RSV, FLU A&B, COVID)  RVPGX2
Influenza A by PCR: NEGATIVE
Influenza B by PCR: NEGATIVE
Resp Syncytial Virus by PCR: NEGATIVE
SARS Coronavirus 2 by RT PCR: NEGATIVE

## 2022-11-10 MED ORDER — ONDANSETRON 4 MG PO TBDP: 4.0000 mg | ORAL_TABLET | Freq: Three times a day (TID) | ORAL | 0 refills | Status: AC | PRN

## 2022-11-10 NOTE — ED Notes (Signed)
Pt was placed in room, RN attempted to assess pt and pt was not found.

## 2022-11-10 NOTE — ED Provider Notes (Signed)
   [facility] Provider Note    Event Date/Time   First MD Initiated Contact with Patient 11/10/22 1434     (approximate)  History   Chief Complaint: Cough  HPI  Carol Lyons is a 32 y.o. female with no significant past medical history who presents to the emergency department for cough chills and nausea.  According to the patient over the past 3 days or so she has had cough congestion chills and nausea.  Patient did have an episode or 2 of vomiting as well.  States she has been able to tolerate fluids today.  Vital signs reassuring, afebrile.  Physical Exam   Triage Vital Signs: ED Triage Vitals  Enc Vitals Group     BP 11/10/22 1252 124/79     Pulse Rate 11/10/22 1252 87     Resp 11/10/22 1252 18     Temp 11/10/22 1252 98.2 F (36.8 C)     Temp Source 11/10/22 1252 Oral     SpO2 11/10/22 1252 95 %     Weight --      Height --      Head Circumference --      Peak Flow --      Pain Score 11/10/22 1251 5     Pain Loc --      Pain Edu? --      Excl. in [place]? --     Most recent vital signs: Vitals:   11/10/22 1252  BP: 124/79  Pulse: 87  Resp: 18  Temp: 98.2 F (36.8 C)  SpO2: 95%    General: Awake, no distress.  CV:  Good peripheral perfusion.  Regular rate and rhythm  Resp:  Normal effort.  Equal breath sounds bilaterally.  Abd:  No distention.  Soft, nontender.  No rebound or guarding.  Benign abdomen. Other:  No obvious congestion.  ED Results / Procedures / Treatments   MEDICATIONS ORDERED IN ED: Medications - No data to display   IMPRESSION / MDM / [place] / ED COURSE  I reviewed the triage vital signs and the nursing notes.  Patient's presentation is most consistent with acute presentation with potential threat to life or bodily function.  Patient presents emergency department for cough congestion nausea and vomiting.  Overall patient appears well denies any nausea currently.  Patient's physical exam is  reassuring, clear lung sounds, benign abdomen.  Patient's vital signs are reassuring afebrile.  Discussed with the patient likely viral URI supportive care at home including ibuprofen Tylenol and fluids.  We will prescribe Zofran to be used as needed for nausea.  Provided my typical viral syndrome return precautions.  Patient agreeable to plan of care.  FINAL CLINICAL IMPRESSION(S) / ED DIAGNOSES   Viral illness/upper respiratory infection  Rx / DC Orders   Zofran  Note:  This document was prepared using Dragon voice recognition software and may include unintentional dictation errors.   Harvest Dark, MD 11/10/22 1452

## 2022-11-10 NOTE — ED Triage Notes (Signed)
Pt comes cough, runny nose ,body aches and vomiting. Pt states this all started on MOnday.

## 2023-04-27 ENCOUNTER — Encounter: Payer: Self-pay | Admitting: Emergency Medicine

## 2023-04-27 ENCOUNTER — Emergency Department: Payer: 59

## 2023-04-27 ENCOUNTER — Other Ambulatory Visit: Payer: Self-pay

## 2023-04-27 ENCOUNTER — Emergency Department
Admission: EM | Admit: 2023-04-27 | Discharge: 2023-04-27 | Disposition: A | Discharge status: Home / Self Care | Payer: 59 | Attending: Emergency Medicine | Admitting: Emergency Medicine

## 2023-04-27 DIAGNOSIS — R1031 Right lower quadrant pain: Secondary | ICD-10-CM | POA: Diagnosis present

## 2023-04-27 LAB — URINALYSIS, ROUTINE W REFLEX MICROSCOPIC
Bilirubin Urine: NEGATIVE
Glucose, UA: NEGATIVE mg/dL
Ketones, ur: 5 mg/dL — AB
Nitrite: NEGATIVE
Protein, ur: NEGATIVE mg/dL
Specific Gravity, Urine: 1.031 — ABNORMAL HIGH (ref 1.005–1.030)
pH: 5 (ref 5.0–8.0)

## 2023-04-27 LAB — CBC
HCT: 35.2 % — ABNORMAL LOW (ref 36.0–46.0)
Hemoglobin: 12.3 g/dL (ref 12.0–15.0)
MCH: 25.9 pg — ABNORMAL LOW (ref 26.0–34.0)
MCHC: 34.9 g/dL (ref 30.0–36.0)
MCV: 74.1 fL — ABNORMAL LOW (ref 80.0–100.0)
Platelets: 305 10*3/uL (ref 150–400)
RBC: 4.75 MIL/uL (ref 3.87–5.11)
RDW: 14.2 % (ref 11.5–15.5)
WBC: 8.1 10*3/uL (ref 4.0–10.5)
nRBC: 0 % (ref 0.0–0.2)

## 2023-04-27 LAB — COMPREHENSIVE METABOLIC PANEL
ALT: 20 U/L (ref 0–44)
AST: 22 U/L (ref 15–41)
Albumin: 4.2 g/dL (ref 3.5–5.0)
Alkaline Phosphatase: 36 U/L — ABNORMAL LOW (ref 38–126)
Anion gap: 11 (ref 5–15)
BUN: 16 mg/dL (ref 6–20)
CO2: 22 mmol/L (ref 22–32)
Calcium: 9 mg/dL (ref 8.9–10.3)
Chloride: 105 mmol/L (ref 98–111)
Creatinine, Ser: 0.57 mg/dL (ref 0.44–1.00)
GFR, Estimated: >60 mL/min (ref >60)
Glucose, Bld: 110 mg/dL — ABNORMAL HIGH (ref 70–99)
Potassium: 3.4 mmol/L — ABNORMAL LOW (ref 3.5–5.1)
Sodium: 138 mmol/L (ref 135–145)
Total Bilirubin: 0.5 mg/dL (ref 0.3–1.2)
Total Protein: 7.2 g/dL (ref 6.5–8.1)

## 2023-04-27 LAB — LIPASE, BLOOD: Lipase: 46 U/L (ref 11–51)

## 2023-04-27 LAB — POC URINE PREG, ED: Preg Test, Ur: NEGATIVE

## 2023-04-27 MED ORDER — IOHEXOL 300 MG/ML  SOLN
100.0000 mL | Freq: Once | INTRAMUSCULAR | Status: AC | PRN
  Administered 2023-04-27: 100 mL via INTRAVENOUS

## 2023-04-27 NOTE — ED Triage Notes (Signed)
Pt presents ambulatory to triage via POV with complaints of bilateral R>L sided lower abdominal pain x 2 days. She notes the pain is intermittent and rates it a 10/10 currently. No meds taken PTA. A&Ox4 at this time. Denies N/V/D, CP or SOB.

## 2023-04-27 NOTE — ED Provider Notes (Signed)
[facility] Provider Note    Event Date/Time   First MD Initiated Contact with Patient 04/27/23 1952     (approximate)   History   Chief Complaint Abdominal Pain   HPI  Carol Lyons is a 32 y.o. female with past medical history of ectopic pregnancy who presents to the ED complaining of abdominal pain.  Patient reports that she has had 2 days of increasing pain in the right lower quadrant of her abdomen.  Pain is constant and not exacerbated or alleviated by anything or particular.  She will occasionally feel nauseous and has vomited a couple of times over the past 2 days, but denies any fevers.  She has not had any dysuria, hematuria, vaginal bleeding, or discharge.     Physical Exam   Triage Vital Signs: ED Triage Vitals  Encounter Vitals Group     BP 04/27/23 1915 108/72     Systolic BP Percentile --      Diastolic BP Percentile --      Pulse Rate 04/27/23 1915 63     Resp 04/27/23 1915 19     Temp 04/27/23 1915 98 F (36.7 C)     Temp Source 04/27/23 1915 Oral     SpO2 04/27/23 1915 97 %     Weight 04/27/23 1919 162 lb (73.5 kg)     Height 04/27/23 1919 5\' 3"  (1.6 m)     Head Circumference --      Peak Flow --      Pain Score 04/27/23 1919 10     Pain Loc --      Pain Education --      Exclude from Growth Chart --     Most recent vital signs: Vitals:   04/27/23 1915  BP: 108/72  Pulse: 63  Resp: 19  Temp: 98 F (36.7 C)  SpO2: 97%    Constitutional: Alert and oriented. Eyes: Conjunctivae are normal. Head: Atraumatic. Nose: No congestion/rhinnorhea. Mouth/Throat: Mucous membranes are moist.  Cardiovascular: Normal rate, regular rhythm. Grossly normal heart sounds.  2+ radial pulses bilaterally. Respiratory: Normal respiratory effort.  No retractions. Lungs CTAB. Gastrointestinal: Soft and tender to palpation in the right lower quadrant with no rebound or guarding. No distention. Musculoskeletal: No lower extremity  tenderness nor edema.  Neurologic:  Normal speech and language. No gross focal neurologic deficits are appreciated.    ED Results / Procedures / Treatments   Labs (all labs ordered are listed, but only abnormal results are displayed) Labs Reviewed  COMPREHENSIVE METABOLIC PANEL - Abnormal; Notable for the following components:      Result Value   Potassium 3.4 (*)    Glucose, Bld 110 (*)    Alkaline Phosphatase 36 (*)    All other components within normal limits  CBC - Abnormal; Notable for the following components:   HCT 35.2 (*)    MCV 74.1 (*)    MCH 25.9 (*)    All other components within normal limits  URINALYSIS, ROUTINE W REFLEX MICROSCOPIC - Abnormal; Notable for the following components:   Color, Urine YELLOW (*)    APPearance HAZY (*)    Specific Gravity, Urine 1.031 (*)    Hgb urine dipstick SMALL (*)    Ketones, ur 5 (*)    Leukocytes,Ua TRACE (*)    Bacteria, UA RARE (*)    All other components within normal limits  LIPASE, BLOOD  POC URINE PREG, ED    RADIOLOGY CT abdomen/pelvis reviewed and  interpreted by me with no inflammatory changes, focal fluid collections, or dilated bowel loops.  PROCEDURES:  Critical Care performed: No  Procedures   MEDICATIONS ORDERED IN ED: Medications  iohexol (OMNIPAQUE) 300 MG/ML solution 100 mL (100 mLs Intravenous Contrast Given 04/27/23 2111)     IMPRESSION / MDM / ASSESSMENT AND PLAN / ED COURSE  I reviewed the triage vital signs and the nursing notes.                              32 y.o. female with past medical history of ectopic pregnancy who presents to the ED complaining of increasing pain in the right lower quadrant of her abdomen over the past 2 days.  Patient's presentation is most consistent with acute presentation with potential threat to life or bodily function.  Differential diagnosis includes, but is not limited to, appendicitis, ectopic pregnancy, UTI, kidney stone.  Patient nontoxic-appearing  and in no acute distress, vital signs are unremarkable.  Her abdomen is soft but she does seem to have focal tenderness in the right lower quadrant.  We will further assess with CT imaging for appendicitis or other acute pathology.  Pregnancy testing is negative and urinalysis shows no signs of infection.  No significant anemia noted and no electrolyte abnormality or AKI.  LFTs and lipase are also unremarkable.  Patient is here with her young son and declines pain or nausea medication currently.  CT imaging is unremarkable, patient with minimal pain on reassessment.  She is appropriate for discharge home with outpatient follow-up, was counseled to return to the ED for new or worsening symptoms.  Patient agrees with plan.      FINAL CLINICAL IMPRESSION(S) / ED DIAGNOSES   Final diagnoses:  Right lower quadrant abdominal pain     Rx / DC Orders   ED Discharge Orders     None        Note:  This document was prepared using Dragon voice recognition software and may include unintentional dictation errors.   Chesley Noon, MD 04/27/23 2153
